# Patient Record
Sex: Male | Born: 1995 | Race: Black or African American | Hispanic: No | Marital: Single | Smoking: Current some day smoker
Health system: Southern US, Community
[De-identification: ages and names within clinical notes are randomized; demographics above are authoritative.]

## PROBLEM LIST (undated history)

## (undated) DIAGNOSIS — K5190 Ulcerative colitis, unspecified, without complications: Secondary | ICD-10-CM

## (undated) DIAGNOSIS — R001 Bradycardia, unspecified: Secondary | ICD-10-CM

---
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
## 2015-03-01 ENCOUNTER — Encounter (HOSPITAL_COMMUNITY): Payer: Self-pay | Admitting: Emergency Medicine

## 2015-03-01 ENCOUNTER — Emergency Department (HOSPITAL_COMMUNITY)
Admission: EM | Admit: 2015-03-01 | Discharge: 2015-03-01 | Disposition: A | Discharge status: Home / Self Care | Payer: 59 | Attending: Emergency Medicine | Admitting: Emergency Medicine

## 2015-03-01 DIAGNOSIS — B9789 Other viral agents as the cause of diseases classified elsewhere: Secondary | ICD-10-CM

## 2015-03-01 DIAGNOSIS — J069 Acute upper respiratory infection, unspecified: Secondary | ICD-10-CM

## 2015-03-01 DIAGNOSIS — H6121 Impacted cerumen, right ear: Secondary | ICD-10-CM | POA: Diagnosis not present

## 2015-03-01 DIAGNOSIS — J029 Acute pharyngitis, unspecified: Secondary | ICD-10-CM | POA: Diagnosis present

## 2015-03-01 LAB — RAPID STREP SCREEN (MED CTR MEBANE ONLY): STREPTOCOCCUS, GROUP A SCREEN (DIRECT): NEGATIVE

## 2015-03-01 MED ORDER — ACETAMINOPHEN 325 MG PO TABS
650.0000 mg | ORAL_TABLET | Freq: Once | ORAL | Status: AC
  Administered 2015-03-01: 650 mg via ORAL
  Filled 2015-03-01: qty 2

## 2015-03-01 NOTE — Discharge Instructions (Signed)
Upper Respiratory Infection, Adult Most upper respiratory infections (URIs) are caused by a virus. A URI affects the nose, throat, and upper air passages. The most common type of URI is often called "the common cold." HOME CARE   Take medicines only as told by your doctor.  Gargle warm saltwater or take cough drops to comfort your throat as told by your doctor.  Use a warm mist humidifier or inhale steam from a shower to increase air moisture. This may make it easier to breathe.  Drink enough fluid to keep your pee (urine) clear or pale yellow.  Eat soups and other clear broths.  Have a healthy diet.  Rest as needed.  Go back to work when your fever is gone or your doctor says it is okay.  You may need to stay home longer to avoid giving your URI to others.  You can also wear a face mask and wash your hands often to prevent spread of the virus.  Use your inhaler more if you have asthma.  Do not use any tobacco products, including cigarettes, chewing tobacco, or electronic cigarettes. If you need help quitting, ask your doctor. GET HELP IF:  You are getting worse, not better.  Your symptoms are not helped by medicine.  You have chills.  You are getting more short of breath.  You have brown or red mucus.  You have yellow or brown discharge from your nose.  You have pain in your face, especially when you bend forward.  You have a fever.  You have puffy (swollen) neck glands.  You have pain while swallowing.  You have white areas in the back of your throat. GET HELP RIGHT AWAY IF:   You have very bad or constant:  Headache.  Ear pain.  Pain in your forehead, behind your eyes, and over your cheekbones (sinus pain).  Chest pain.  You have long-lasting (chronic) lung disease and any of the following:  Wheezing.  Long-lasting cough.  Coughing up blood.  A change in your usual mucus.  You have a stiff neck.  You have changes in  your:  Vision.  Hearing.  Thinking.  Mood. MAKE SURE YOU:   Understand these instructions.  Will watch your condition.  Will get help right away if you are not doing well or get worse.   This information is not intended to replace advice given to you by your health care provider. Make sure you discuss any questions you have with your health care provider.   Document Released: 07/17/2007 Document Revised: 06/14/2014 Document Reviewed: 05/05/2013 Elsevier Interactive Patient Education [employer].

## 2015-03-01 NOTE — ED Notes (Signed)
Patient states sore throat and fever that started yesterday.   Patient states he has taken ibuprofen, nyquil and dayquil for symptoms control with no relief.   Patient also states has some nasal congestion.

## 2015-03-01 NOTE — ED Provider Notes (Signed)
CSN: [id number]     Arrival date & time 03/01/15  0815 History   First MD Initiated Contact with Patient 03/01/15 [phone]     Chief Complaint  Patient presents with  . Sore Throat  . Fever     (Consider location/radiation/quality/duration/timing/severity/associated sxs/prior Treatment) HPI Comments: Greg Grant is a 20 y.o. M with no PMH presenting to ED for evaluation of sore throat.  Patient reports onset of fevers (Tmax 101), sore throat, cough, nasal congestion, post-nasal drip yesterday.  He attempted to take Dayquil, tylenol, and ibuprofen, but did not feel any better so he came in this morning to be evaluated.  Cough is occasionally productive of yellow mucous that seems to be from post-nasal drip.  Patient denies any CP, SOB, LE edema, N/V/D, ear pain.  Wonders if he should have some pain medicine other than tylenol and ibuprofen.  Patient is a 20 y.o. male presenting with pharyngitis. The history is provided by the patient.  Sore Throat This is a new problem. The current episode started yesterday. The problem occurs constantly. The problem has been gradually worsening. Associated symptoms include congestion, coughing, a fever, headaches and a sore throat. Pertinent negatives include no abdominal pain, change in bowel habit, chest pain, nausea or vomiting. Nothing aggravates the symptoms. He has tried acetaminophen and NSAIDs for the symptoms. The treatment provided no relief.    History reviewed. No pertinent past medical history. History reviewed. No pertinent past surgical history. No family history on file. Social History  Substance Use Topics  . Smoking status: Never Smoker   . Smokeless tobacco: None  . Alcohol Use: No    Review of Systems  Constitutional: Positive for fever.  HENT: Positive for congestion and sore throat.   Respiratory: Positive for cough.   Cardiovascular: Negative for chest pain.  Gastrointestinal: Negative for nausea, vomiting, abdominal pain and  change in bowel habit.  Neurological: Positive for headaches.  All other systems reviewed and are negative.     Allergies  Review of patient's allergies indicates no known allergies.  Home Medications   Prior to Admission medications   Medication Sig Start Date End Date Taking? Authorizing Provider  ibuprofen (ADVIL,MOTRIN) 600 MG tablet Take 1 tablet (600 mg total) by mouth every 6 (six) hours as needed. 06/03/14   Tatyana Kirichenko, PA-C   BP 146/90 mmHg  Pulse 82  Temp(Src) 99.4 F (37.4 C) (Oral)  Resp 18  Wt 94.802 kg  SpO2 98% Physical Exam  Constitutional: He is oriented to person, place, and time. He appears well-developed and well-nourished. No distress.  HENT:  Head: Normocephalic and atraumatic.  R TM obscured by cerumen, L TM clear. OP erythematous without exudate. MMM.  Eyes: Conjunctivae and EOM are normal. Pupils are equal, round, and reactive to light. Right eye exhibits no discharge. Left eye exhibits no discharge. No scleral icterus.  Neck: Neck supple.  Cardiovascular: Normal rate, regular rhythm, normal heart sounds and intact distal pulses.   No murmur heard. Pulmonary/Chest: Effort normal and breath sounds normal. No respiratory distress. He has no wheezes. He has no rales.  Abdominal: Soft. He exhibits no distension. There is no tenderness.  Musculoskeletal: He exhibits no edema or tenderness.  Lymphadenopathy:    He has no cervical adenopathy.  Neurological: He is alert and oriented to person, place, and time.  Skin: Skin is warm and dry. No rash noted.  Psychiatric: He has a normal mood and affect. His behavior is normal.    ED Course  Procedures (including critical care time) Labs Review Labs Reviewed  RAPID STREP SCREEN (NOT AT [facility])    Imaging Review No results found. I have personally reviewed and evaluated these images and lab results as part of my medical decision-making.   EKG Interpretation None      MDM   Final diagnoses:   None    20 y.o. M presenting with URI symptoms since yesterday.  Rapid strep negative and sent for culture.  Symptoms and time course consistent with viral URI.  No evidence of pneumonia with clear lung exam, no evidence of AOM or other bacterial infectious process.  Discussed natural course, symptomatic management, and return precautions with patient.  Patient given tylenol x1 in ED for sore throat.  Patient safe for discharge home and agreeable to plan.    Virginia Crews, MD 03/01/15 [id number]  Elnora Morrison, MD 03/01/15 [phone]

## 2015-03-03 LAB — CULTURE, GROUP A STREP (THRC)

## 2016-02-12 HISTORY — PX: BACK SURGERY: SHX140

## 2016-05-23 ENCOUNTER — Encounter (HOSPITAL_COMMUNITY): Payer: Self-pay | Admitting: *Deleted

## 2016-05-23 ENCOUNTER — Emergency Department (HOSPITAL_COMMUNITY)
Admission: EM | Admit: 2016-05-23 | Discharge: 2016-05-23 | Disposition: A | Discharge status: Home / Self Care | Payer: 59 | Attending: Emergency Medicine | Admitting: Emergency Medicine

## 2016-05-23 DIAGNOSIS — X501XXA Overexertion from prolonged static or awkward postures, initial encounter: Secondary | ICD-10-CM | POA: Diagnosis not present

## 2016-05-23 DIAGNOSIS — S3992XA Unspecified injury of lower back, initial encounter: Secondary | ICD-10-CM | POA: Diagnosis present

## 2016-05-23 DIAGNOSIS — Z79899 Other long term (current) drug therapy: Secondary | ICD-10-CM | POA: Insufficient documentation

## 2016-05-23 DIAGNOSIS — S39012A Strain of muscle, fascia and tendon of lower back, initial encounter: Secondary | ICD-10-CM | POA: Diagnosis not present

## 2016-05-23 DIAGNOSIS — Y999 Unspecified external cause status: Secondary | ICD-10-CM | POA: Insufficient documentation

## 2016-05-23 DIAGNOSIS — M5116 Intervertebral disc disorders with radiculopathy, lumbar region: Secondary | ICD-10-CM

## 2016-05-23 DIAGNOSIS — Y929 Unspecified place or not applicable: Secondary | ICD-10-CM | POA: Diagnosis not present

## 2016-05-23 DIAGNOSIS — Y939 Activity, unspecified: Secondary | ICD-10-CM | POA: Insufficient documentation

## 2016-05-23 DIAGNOSIS — M5416 Radiculopathy, lumbar region: Secondary | ICD-10-CM | POA: Diagnosis not present

## 2016-05-23 MED ORDER — OXYCODONE-ACETAMINOPHEN 5-325 MG PO TABS
2.0000 | ORAL_TABLET | Freq: Once | ORAL | Status: AC
  Administered 2016-05-23: 2 via ORAL
  Filled 2016-05-23: qty 2

## 2016-05-23 MED ORDER — PREDNISONE 20 MG PO TABS: ORAL_TABLET | ORAL | 0 refills | Status: DC

## 2016-05-23 MED ORDER — PREDNISONE 20 MG PO TABS
60.0000 mg | ORAL_TABLET | Freq: Once | ORAL | Status: AC
  Administered 2016-05-23: 60 mg via ORAL
  Filled 2016-05-23: qty 3

## 2016-05-23 NOTE — ED Notes (Signed)
Pt stable, understands discharge instructions, and reasons for return.

## 2016-05-23 NOTE — ED Triage Notes (Signed)
Pt is scheduled to have back surgery next month and now with pain to lower back and states he was laying down and twisted his back and has been in pain ever since. Ambulatory.  No incontinence of bowel or urine

## 2016-05-23 NOTE — Discharge Instructions (Signed)
Take prednisone as prescribed.   Call your primary care doctor (Dr. Lavone Neri) and get further pain medication refills from her.   You can call Dr. Saintclair Halsted to get a second opinion. Or you can see your neurosurgeon for follow up   Return to ER if you have worse weakness, numbness, trouble urinating, fever, vomiting.

## 2016-05-23 NOTE — ED Provider Notes (Addendum)
[facility] DEPT Provider Note   CSN: [id number] Arrival date & time: 05/23/16  1428  By signing my name below, I, Greg Grant, attest that this documentation has been prepared under the direction and in the presence of Greg Freeze, Greg Grant. Electronically Signed: Collene Grant, Scribe. 05/23/16. 3:13 PM.  History   Chief Complaint Chief Complaint  Patient presents with  . Back Pain    HPI Comments: Greg Grant is a 21 y.o. male with a history of a slipped disc, who presents to the Emergency Department complaining of chronic back pain, which acutely worsened a few days ago. Patient states he was lying down, when he twisted his back and heard his back pop. Patient states he has had pain ever since. Patient states he has a history of a slipped disc, he is scheduled to have back surgery next month by neurosurgeon in [place]. He has chronic leg weakness that is unchanged. Patient reports taken multiple pain medications including narcotics, injections, and muscle relaxer's with no relief. Patient states his pain is worse with bending over. Patient is ambulatory in the emergency department. Patient denies any lower extremity numbness, worsening weakness, or bowel/bladder incontinence.   Greg Grant narcotic database was reviewed and revealed 30 pills of hydrocodone filled on 3/30.    The history is provided by the patient. No language interpreter was used.    History reviewed. No pertinent past medical history.  There are no active problems to display for this patient.   History reviewed. No pertinent surgical history.     Home Medications    Prior to Admission medications   Medication Sig Start Date End Date Taking? Authorizing Provider  acetaminophen (TYLENOL) 500 MG tablet Take 500 mg by mouth every 6 (six) hours as needed for mild pain.    Historical Provider, Greg Grant  ibuprofen (ADVIL,MOTRIN) 600 MG tablet Take 1 tablet (600 mg total) by mouth every 6 (six) hours as needed. 06/03/14    Greg Senior, PA-C    Family History No family history on file.  Social History Social History  Substance Use Topics  . Smoking status: Never Smoker  . Smokeless tobacco: Never Used  . Alcohol use No     Allergies   Patient has no known allergies.   Review of Systems Review of Systems  Musculoskeletal: Positive for back pain. Negative for gait problem.  Neurological: Negative for weakness and numbness.  All other systems reviewed and are negative.    Physical Exam Updated Vital Signs BP 123/70 (BP Location: Right Arm)   Pulse 72   Resp 18   SpO2 98%   Physical Exam  Constitutional: He is oriented to person, place, and time. He appears well-developed and well-nourished.  HENT:  Head: Normocephalic.  Eyes: EOM are normal.  Neck: Normal range of motion.  Cardiovascular: Normal rate.   Pulmonary/Chest: Effort normal.  Abdominal: He exhibits no distension.  Musculoskeletal: Normal range of motion.  Positive straight leg raise. Right para lumbar tenderness.   Neurological: He is alert and oriented to person, place, and time.  No saddle anesthesia. + straight leg raise R leg. Nl sensation lower extremity. Able to ambulate by himself   Skin: Skin is warm.  Psychiatric: He has a normal mood and affect.  Nursing note and vitals reviewed.    ED Treatments / Results  DIAGNOSTIC STUDIES: Oxygen Saturation is 98% on RA, normal by my interpretation.    COORDINATION OF CARE: 3:13 PM Discussed treatment plan with pt at bedside and pt  agreed to plan, which includes pain medication.    Labs (all labs ordered are listed, but only abnormal results are displayed) Labs Reviewed - No data to display  EKG  EKG Interpretation None       Radiology No results found.  Procedures Procedures (including critical care time)  Medications Ordered in ED Medications - No data to display   Initial Impression / Assessment and Plan / ED Course  I have reviewed the  triage vital signs and the nursing notes.  Pertinent labs & imaging results that were available during my care of the patient were reviewed by me and considered in my medical decision making (see chart for details).     Greg Grant is a 21 y.o. male here with back pain. This is chronic problem and has been going on for years. Had tried injections, narcotics, and muscle relaxants with no relief. Had worsening pain several days ago. Patient's neuro exam is unremarkable and there is no saddle anesthesia. He already has neurosurgery follow up scheduled and a procedure scheduled. I reviewed Greg Grant database and he received 30 7.5 mg vicodin on 3/30 and 30 hydrocodone on 3/13 from Dr. Lavone Grant, Greg Grant. I told him that he can get steroids here and percocet but needs to call Greg Grant for pain medication prescriptions. I offered muscle relaxants but he states that they don't work and refused prescription. Offered IM injection for pain but he is afraid of needles and doesn't want it. He wants a second opinion with neurosurgery so I referred to on call neurosurgeon. No need for further workup in the ED.   Final Clinical Impressions(s) / ED Diagnoses   Final diagnoses:  None    New Prescriptions New Prescriptions   No medications on file   ,I personally performed the services described in this documentation, which was scribed in my presence. The recorded information has been reviewed and is accurate.    Greg Freeze, Greg Grant 05/23/16 Greg Grant Greg Schnelle, Greg Grant 05/23/16 1538

## 2016-06-03 ENCOUNTER — Ambulatory Visit (HOSPITAL_COMMUNITY)
Admission: EM | Admit: 2016-06-03 | Discharge: 2016-06-03 | Disposition: A | Discharge status: Home / Self Care | Payer: 59 | Attending: Family Medicine | Admitting: Family Medicine

## 2016-06-03 ENCOUNTER — Encounter (HOSPITAL_COMMUNITY): Payer: Self-pay | Admitting: Emergency Medicine

## 2016-06-03 DIAGNOSIS — R300 Dysuria: Secondary | ICD-10-CM | POA: Diagnosis not present

## 2016-06-03 DIAGNOSIS — R070 Pain in throat: Secondary | ICD-10-CM | POA: Diagnosis not present

## 2016-06-03 DIAGNOSIS — Z202 Contact with and (suspected) exposure to infections with a predominantly sexual mode of transmission: Secondary | ICD-10-CM

## 2016-06-03 DIAGNOSIS — R369 Urethral discharge, unspecified: Secondary | ICD-10-CM | POA: Diagnosis not present

## 2016-06-03 DIAGNOSIS — J029 Acute pharyngitis, unspecified: Secondary | ICD-10-CM | POA: Diagnosis not present

## 2016-06-03 LAB — POCT URINALYSIS DIP (DEVICE)
Glucose, UA: NEGATIVE mg/dL
Ketones, ur: NEGATIVE mg/dL
Leukocytes, UA: NEGATIVE
NITRITE: NEGATIVE
Protein, ur: NEGATIVE mg/dL
Specific Gravity, Urine: 1.025 (ref 1.005–1.030)
Urobilinogen, UA: 1 mg/dL (ref 0.0–1.0)
pH: 7 (ref 5.0–8.0)

## 2016-06-03 MED ORDER — PHENAZOPYRIDINE HCL 200 MG PO TABS: 200.0000 mg | ORAL_TABLET | Freq: Three times a day (TID) | ORAL | 0 refills | Status: DC

## 2016-06-03 MED ORDER — CEFTRIAXONE SODIUM 250 MG IJ SOLR
INTRAMUSCULAR | Status: AC
  Filled 2016-06-03: qty 250

## 2016-06-03 MED ORDER — STERILE WATER FOR INJECTION IJ SOLN
INTRAMUSCULAR | Status: AC
  Filled 2016-06-03: qty 10

## 2016-06-03 MED ORDER — CEFTRIAXONE SODIUM 250 MG IJ SOLR
250.0000 mg | Freq: Once | INTRAMUSCULAR | Status: AC
  Administered 2016-06-03: 250 mg via INTRAMUSCULAR

## 2016-06-03 MED ORDER — AZITHROMYCIN 250 MG PO TABS
1000.0000 mg | ORAL_TABLET | Freq: Once | ORAL | Status: AC
  Administered 2016-06-03: 1000 mg via ORAL

## 2016-06-03 MED ORDER — AZITHROMYCIN 250 MG PO TABS
ORAL_TABLET | ORAL | Status: AC
  Filled 2016-06-03: qty 4

## 2016-06-03 NOTE — ED Provider Notes (Signed)
[place]    CSN: [id number] Arrival date & time: 06/03/16  1441     History   Chief Complaint Chief Complaint  Patient presents with  . Exposure to STD  . Penile Discharge    HPI Greg Grant is a 21 y.o. male.   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 He has a same sex partner. The partner is new and he has not had protected intercourse. He does have orals sex as well.  He's had the discharge for several days. He has burning as well and complains of a sore throat.        History reviewed. No pertinent past medical history.  There are no active problems to display for this patient.   History reviewed. No pertinent surgical history.     Home Medications    Prior to Admission medications   Medication Sig Start Date End Date Taking? Authorizing Provider  phenazopyridine (PYRIDIUM) 200 MG tablet Take 1 tablet (200 mg total) by mouth 3 (three) times daily. 06/03/16   Robyn Haber, MD    Family History History reviewed. No pertinent family history.  Social History Social History  Substance Use Topics  . Smoking status: Never Smoker  . Smokeless tobacco: Never Used  . Alcohol use No     Allergies   Patient has no known allergies.   Review of Systems Review of Systems  HENT: Positive for sore throat.   Genitourinary: Positive for dysuria.  All other systems reviewed and are negative.    Physical Exam Triage Vital Signs ED Triage Vitals  Enc Vitals Group     BP 06/03/16 1527 135/62     Pulse Rate 06/03/16 1527 (!) 57     Resp 06/03/16 1527 18     Temp 06/03/16 1527 98.5 F (36.9 C)     Temp Source 06/03/16 1527 Oral     SpO2 06/03/16 1527 100 %     Weight --      Height --      Head Circumference --      Peak Flow --      Pain Score 06/03/16 1526 0     Pain Loc --      Pain Edu? --      Excl. in [place]? --    No data found.   Updated Vital  Signs BP 135/62 (BP Location: Right Arm)   Pulse (!) 57   Temp 98.5 F (36.9 C) (Oral)   Resp 18   SpO2 100%    Physical Exam  Constitutional: He is oriented to person, place, and time. He appears well-developed and well-nourished.  HENT:  Head: Normocephalic.  Right Ear: External ear normal.  Left Ear: External ear normal.  Mouth/Throat: Oropharynx is clear and moist.  Eyes: Conjunctivae and EOM are normal. Pupils are equal, round, and reactive to light.  Neck: Normal range of motion. Neck supple.  Pulmonary/Chest: Effort normal.  Genitourinary:  Genitourinary Comments: Green-yellow penile discharge  Musculoskeletal: Normal range of motion.  Neurological: He is alert and oriented to person, place, and time.  Skin: Skin is warm and dry.  Nursing note and vitals reviewed.    UC Treatments / Results  Labs (all labs ordered are listed, but only abnormal results are displayed) Labs Reviewed  POCT URINALYSIS DIP (DEVICE) - Abnormal; Notable for the following:       Result Value   Bilirubin Urine SMALL (*)  Hgb urine dipstick TRACE (*)    All other components within normal limits  HIV ANTIBODY (ROUTINE TESTING)  HSV 2 ANTIBODY, IGG  URINE CYTOLOGY ANCILLARY ONLY    EKG  EKG Interpretation None       Radiology No results found.  Procedures Procedures (including critical care time)  Medications Ordered in UC Medications  cefTRIAXone (ROCEPHIN) injection 250 mg (250 mg Intramuscular Given 06/03/16 1607)  azithromycin (ZITHROMAX) tablet 1,000 mg (1,000 mg Oral Given 06/03/16 1606)     Initial Impression / Assessment and Plan / UC Course  I have reviewed the triage vital signs and the nursing notes.  Pertinent labs & imaging results that were available during my care of the patient were reviewed by me and considered in my medical decision making (see chart for details).     Final Clinical Impressions(s) / UC Diagnoses   Final diagnoses:  STD exposure     New Prescriptions New Prescriptions   PHENAZOPYRIDINE (PYRIDIUM) 200 MG TABLET    Take 1 tablet (200 mg total) by mouth 3 (three) times daily.     Robyn Haber, MD 06/03/16 [phone]

## 2016-06-03 NOTE — ED Notes (Signed)
Dirty and clean urine collected.

## 2016-06-03 NOTE — ED Triage Notes (Signed)
The patient presented to the [facility] with a complaint of an exposure to gonorrhea and he stated he has been having dysuria and a penile discharge.

## 2016-06-04 LAB — HIV ANTIBODY (ROUTINE TESTING W REFLEX): HIV Screen 4th Generation wRfx: NONREACTIVE

## 2016-06-04 LAB — HSV 2 ANTIBODY, IGG: HSV 2 Glycoprotein G Ab, IgG: <0.91 index (ref 0.00–0.90)

## 2016-06-04 LAB — URINE CYTOLOGY ANCILLARY ONLY
Chlamydia: NEGATIVE
Neisseria Gonorrhea: POSITIVE — AB
Trichomonas: NEGATIVE

## 2016-11-20 ENCOUNTER — Emergency Department (HOSPITAL_COMMUNITY)
Admission: EM | Admit: 2016-11-20 | Discharge: 2016-11-21 | Disposition: A | Discharge status: Home / Self Care | Payer: 59 | Attending: Emergency Medicine | Admitting: Emergency Medicine

## 2016-11-20 ENCOUNTER — Encounter (HOSPITAL_COMMUNITY): Payer: Self-pay | Admitting: Emergency Medicine

## 2016-11-20 DIAGNOSIS — W269XXA Contact with unspecified sharp object(s), initial encounter: Secondary | ICD-10-CM | POA: Insufficient documentation

## 2016-11-20 DIAGNOSIS — Y999 Unspecified external cause status: Secondary | ICD-10-CM | POA: Insufficient documentation

## 2016-11-20 DIAGNOSIS — Z5321 Procedure and treatment not carried out due to patient leaving prior to being seen by health care provider: Secondary | ICD-10-CM | POA: Insufficient documentation

## 2016-11-20 DIAGNOSIS — Y9389 Activity, other specified: Secondary | ICD-10-CM | POA: Insufficient documentation

## 2016-11-20 DIAGNOSIS — S6992XA Unspecified injury of left wrist, hand and finger(s), initial encounter: Secondary | ICD-10-CM | POA: Diagnosis present

## 2016-11-20 DIAGNOSIS — Y929 Unspecified place or not applicable: Secondary | ICD-10-CM | POA: Diagnosis not present

## 2016-11-20 MED ORDER — IBUPROFEN 400 MG PO TABS
ORAL_TABLET | ORAL | Status: AC
  Filled 2016-11-20: qty 1

## 2016-11-20 MED ORDER — IBUPROFEN 400 MG PO TABS
400.0000 mg | ORAL_TABLET | Freq: Once | ORAL | Status: AC
  Administered 2016-11-20: 400 mg via ORAL

## 2016-11-20 NOTE — ED Triage Notes (Signed)
Pt presents to ED for assessment of laceration (approx 1-2 inches) to the index finger of his left hand while attempting to cut potatoes tonight.  No underlying structures visible.  Bleeding controlled.  Pt able to move finger.

## 2016-11-20 NOTE — ED Notes (Signed)
No answer in waiting area.

## 2016-12-24 ENCOUNTER — Encounter (HOSPITAL_COMMUNITY): Payer: Self-pay | Admitting: Emergency Medicine

## 2016-12-24 ENCOUNTER — Emergency Department (HOSPITAL_COMMUNITY)
Admission: EM | Admit: 2016-12-24 | Discharge: 2016-12-24 | Disposition: A | Discharge status: Home / Self Care | Payer: 59 | Attending: Emergency Medicine | Admitting: Emergency Medicine

## 2016-12-24 ENCOUNTER — Emergency Department (HOSPITAL_COMMUNITY): Payer: 59

## 2016-12-24 DIAGNOSIS — F17210 Nicotine dependence, cigarettes, uncomplicated: Secondary | ICD-10-CM | POA: Diagnosis not present

## 2016-12-24 DIAGNOSIS — Z9889 Other specified postprocedural states: Secondary | ICD-10-CM | POA: Diagnosis not present

## 2016-12-24 DIAGNOSIS — M545 Low back pain, unspecified: Secondary | ICD-10-CM

## 2016-12-24 DIAGNOSIS — G8929 Other chronic pain: Secondary | ICD-10-CM

## 2016-12-24 MED ORDER — OXYCODONE-ACETAMINOPHEN 5-325 MG PO TABS
1.0000 | ORAL_TABLET | Freq: Once | ORAL | Status: AC
  Administered 2016-12-24: 1 via ORAL
  Filled 2016-12-24: qty 1

## 2016-12-24 MED ORDER — PREDNISONE 10 MG PO TABS: 20.0000 mg | ORAL_TABLET | Freq: Two times a day (BID) | ORAL | 0 refills | Status: DC

## 2016-12-24 MED ORDER — DICLOFENAC SODIUM 50 MG PO TBEC: 50.0000 mg | DELAYED_RELEASE_TABLET | Freq: Two times a day (BID) | ORAL | 0 refills | Status: DC

## 2016-12-24 MED ORDER — CYCLOBENZAPRINE HCL 10 MG PO TABS
10.0000 mg | ORAL_TABLET | Freq: Once | ORAL | Status: AC
  Administered 2016-12-24: 10 mg via ORAL
  Filled 2016-12-24: qty 1

## 2016-12-24 MED ORDER — CYCLOBENZAPRINE HCL 10 MG PO TABS: 10.0000 mg | ORAL_TABLET | Freq: Two times a day (BID) | ORAL | 0 refills | Status: DC | PRN

## 2016-12-24 NOTE — ED Triage Notes (Signed)
Patient reports that had surgery on back in may and had pain since. Reports that surgeon released him still having pain and was told to follow up with PCP but hasnt done that.  Reports since working pain is more severe.

## 2016-12-24 NOTE — ED Provider Notes (Signed)
[facility] Grant Provider Note   CSN: [id number] Arrival date & time: 12/24/16  1657     History   Chief Complaint Chief Complaint  Patient presents with  . Back Pain    HPI Greg Grant is a 21 y.o. male who presents to the ED with back pain. Patient with hx of back pain x 2 years and then dx with herniated disc in the lower back.Patient reports that he had surgery on his back in May but continues to have pain since then. The surgeon, Dr. Yong Channel in [place],  released him but he continues to have pain and was told to f/u with his PCP in [place]. Patient has not f/u with his PCP. He presents today for worsening pain. Patient is in school here in [place] at [employer]. Patient reports taking ibuprofen and tylenol without relief.   HPI  History reviewed. No pertinent past medical history.  There are no active problems to display for this patient.   Past Surgical History:  Procedure Laterality Date  . BACK SURGERY         Home Medications    Prior to Admission medications   Medication Sig Start Date End Date Taking? Authorizing Provider  cyclobenzaprine (FLEXERIL) 10 MG tablet Take 1 tablet (10 mg total) 2 (two) times daily as needed by mouth for muscle spasms. 12/24/16   Ashley Murrain, NP  diclofenac (VOLTAREN) 50 MG EC tablet Take 1 tablet (50 mg total) 2 (two) times daily by mouth. 12/24/16   Ashley Murrain, NP  phenazopyridine (PYRIDIUM) 200 MG tablet Take 1 tablet (200 mg total) by mouth 3 (three) times daily. 06/03/16   Robyn Haber, MD  predniSONE (DELTASONE) 10 MG tablet Take 2 tablets (20 mg total) 2 (two) times daily with a meal by mouth. 12/24/16   Ashley Murrain, NP    Family History No family history on file.  Social History Social History   Tobacco Use  . Smoking status: Current Some Day Smoker    Types: Cigarettes  . Smokeless tobacco: Never Used  Substance Use Topics  . Alcohol use: No  . Drug use: No      Allergies   Toradol [ketorolac tromethamine]   Review of Systems Review of Systems  Constitutional: Negative for chills and fever.  HENT: Negative.   Eyes: Negative for visual disturbance.  Respiratory: Negative for cough and shortness of breath.   Cardiovascular: Negative for chest pain and leg swelling.  Gastrointestinal: Negative for abdominal pain, nausea and vomiting.  Genitourinary: Negative for dysuria and frequency.       No loss of control of bladder or bowels.  Musculoskeletal: Positive for back pain. Negative for neck pain and neck stiffness.  Skin: Negative for wound.  Neurological: Negative for syncope and headaches.  Psychiatric/Behavioral: Negative for confusion. The patient is not nervous/anxious.      Physical Exam Updated Vital Signs BP (!) 147/78 (BP Location: Left Arm)   Pulse (!) 52   Temp 98.7 F (37.1 C) (Oral)   Resp 14   Ht 6' 1"  (1.854 m)   Wt 81.6 kg (180 lb)   SpO2 100%   BMI 23.75 kg/m   Physical Exam  Constitutional: He is oriented to person, place, and time. He appears well-developed and well-nourished. No distress.  HENT:  Head: Normocephalic and atraumatic.  Eyes: EOM are normal. Pupils are equal, round, and reactive to light.  Neck: Normal range of motion. Neck supple.  Cardiovascular: Normal rate  and regular rhythm.  Pulmonary/Chest: Effort normal. No respiratory distress. He has no wheezes. He has no rales.  Abdominal: Soft. Bowel sounds are normal. There is no tenderness.  Musculoskeletal: Normal range of motion. He exhibits no edema.       Lumbar back: He exhibits tenderness. He exhibits normal range of motion, no deformity, no spasm and normal pulse.  Neurological: He is alert and oriented to person, place, and time. He has normal strength.  Reflex Scores:      Bicep reflexes are 2+ on the right side and 2+ on the left side.      Brachioradialis reflexes are 2+ on the right side and 2+ on the left side.      Patellar  reflexes are 2+ on the right side and 2+ on the left side. Ambulatory without foot drag.  Skin: Skin is warm and dry.  Psychiatric: He has a normal mood and affect. His behavior is normal.  Nursing note and vitals reviewed.    ED Treatments / Results  Labs (all labs ordered are listed, but only abnormal results are displayed) Labs Reviewed - No data to display  Radiology Dg Lumbar Spine Complete  Result Date: 12/24/2016 CLINICAL DATA:  Pain in the lower back at surgical site EXAM: LUMBAR SPINE - COMPLETE 4+ VIEW COMPARISON:  None. FINDINGS: There is no evidence of lumbar spine fracture. Alignment is normal. Intervertebral disc spaces are maintained. Calcified phleboliths in the pelvis IMPRESSION: Negative. Electronically Signed   By: Donavan Foil M.D.   On: 12/24/2016 20:45    Procedures Procedures (including critical care time)  Medications Ordered in ED Medications  oxyCODONE-acetaminophen (PERCOCET/ROXICET) 5-325 MG per tablet 1 tablet (1 tablet Oral Given 12/24/16 1941)  cyclobenzaprine (FLEXERIL) tablet 10 mg (10 mg Oral Given 12/24/16 1941)     Initial Impression / Assessment and Plan / ED Course  I have reviewed the triage vital signs and the nursing notes. Patient with hx of chronic back pain and back surgery. No neurological deficits and normal neuro exam.  Patient can walk but states is painful.  No loss of bowel or bladder control.  No concern for cauda equina.  No fever, night sweats, weight loss, h/o cancer, IVDU.  RICE protocol and pain medicine indicated and discussed with patient.   Final Clinical Impressions(s) / ED Diagnoses   Final diagnoses:  Chronic bilateral low back pain without sciatica    ED Discharge Orders        Ordered    cyclobenzaprine (FLEXERIL) 10 MG tablet  2 times daily PRN     12/24/16 2052    diclofenac (VOLTAREN) 50 MG EC tablet  2 times daily     12/24/16 2052    predniSONE (DELTASONE) 10 MG tablet  2 times daily with meals      12/24/16 2108       Debroah Baller [place], [place] 12/24/16 2117    Sherwood Gambler, MD 12/25/16 [phone]

## 2016-12-24 NOTE — Discharge Instructions (Signed)
It is important that you follow up with your doctor to discuss pain management for your continued low back pain.

## 2016-12-27 ENCOUNTER — Emergency Department (HOSPITAL_COMMUNITY)
Admission: EM | Admit: 2016-12-27 | Discharge: 2016-12-28 | Disposition: A | Discharge status: Home / Self Care | Payer: 59 | Attending: Emergency Medicine | Admitting: Emergency Medicine

## 2016-12-27 ENCOUNTER — Encounter (HOSPITAL_COMMUNITY): Payer: Self-pay | Admitting: Emergency Medicine

## 2016-12-27 ENCOUNTER — Emergency Department (HOSPITAL_COMMUNITY): Payer: 59

## 2016-12-27 DIAGNOSIS — Y92410 Unspecified street and highway as the place of occurrence of the external cause: Secondary | ICD-10-CM | POA: Insufficient documentation

## 2016-12-27 DIAGNOSIS — M545 Low back pain, unspecified: Secondary | ICD-10-CM

## 2016-12-27 DIAGNOSIS — F17210 Nicotine dependence, cigarettes, uncomplicated: Secondary | ICD-10-CM | POA: Diagnosis not present

## 2016-12-27 DIAGNOSIS — Y9389 Activity, other specified: Secondary | ICD-10-CM | POA: Diagnosis not present

## 2016-12-27 DIAGNOSIS — Y999 Unspecified external cause status: Secondary | ICD-10-CM | POA: Diagnosis not present

## 2016-12-27 DIAGNOSIS — S3992XA Unspecified injury of lower back, initial encounter: Secondary | ICD-10-CM | POA: Diagnosis present

## 2016-12-27 DIAGNOSIS — G8911 Acute pain due to trauma: Secondary | ICD-10-CM | POA: Insufficient documentation

## 2016-12-27 DIAGNOSIS — Z79899 Other long term (current) drug therapy: Secondary | ICD-10-CM | POA: Insufficient documentation

## 2016-12-27 MED ORDER — HYDROCODONE-ACETAMINOPHEN 5-325 MG PO TABS
2.0000 | ORAL_TABLET | Freq: Once | ORAL | Status: AC
  Administered 2016-12-27: 2 via ORAL
  Filled 2016-12-27: qty 2
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
## 2016-12-28 MED ORDER — HYDROCODONE-ACETAMINOPHEN 5-325 MG PO TABS: 1.0000 | ORAL_TABLET | Freq: Four times a day (QID) | ORAL | 0 refills | Status: DC | PRN

## 2016-12-28 MED ORDER — IBUPROFEN 800 MG PO TABS: 800.0000 mg | ORAL_TABLET | Freq: Three times a day (TID) | ORAL | 0 refills | Status: DC

## 2016-12-28 MED ORDER — CYCLOBENZAPRINE HCL 10 MG PO TABS: 10.0000 mg | ORAL_TABLET | Freq: Two times a day (BID) | ORAL | 0 refills | Status: DC | PRN

## 2016-12-28 NOTE — ED Notes (Signed)
Went in to ambulate pt. And patient couldn't make it to the door without slumping over in pain. I returned pt back to the bed and notified RN.

## 2016-12-28 NOTE — ED Notes (Signed)
PT'S ATTEMPT TO AMBULATE IN THE HALL UNSUCCESSFUL. ERA MADE AWARE.

## 2017-10-10 ENCOUNTER — Encounter (HOSPITAL_COMMUNITY): Payer: Self-pay | Admitting: Emergency Medicine

## 2017-10-10 ENCOUNTER — Ambulatory Visit (HOSPITAL_COMMUNITY)
Admission: EM | Admit: 2017-10-10 | Discharge: 2017-10-10 | Disposition: A | Discharge status: Home / Self Care | Payer: 59 | Attending: Internal Medicine | Admitting: Internal Medicine

## 2017-10-10 DIAGNOSIS — L03012 Cellulitis of left finger: Secondary | ICD-10-CM

## 2017-10-10 MED ORDER — BUPIVACAINE HCL (PF) 0.5 % IJ SOLN
INTRAMUSCULAR | Status: AC
  Filled 2017-10-10: qty 10

## 2017-10-10 MED ORDER — DICLOFENAC SODIUM 50 MG PO TBEC: 50.0000 mg | DELAYED_RELEASE_TABLET | Freq: Two times a day (BID) | ORAL | 0 refills | Status: AC

## 2017-10-10 MED ORDER — AMOXICILLIN-POT CLAVULANATE 875-125 MG PO TABS: 1.0000 | ORAL_TABLET | Freq: Two times a day (BID) | ORAL | 0 refills | Status: AC

## 2017-10-10 NOTE — Discharge Instructions (Signed)
Start augmentin as directed. Start diclofenac as directed. You can take tylenol for additional pain relief needed. Warm compress to the finger. If experiencing worsening symptoms, pain to the finger pad, numbness, discoloration to the finger tip, go to the emergency department for further evaluation. Otherwise, follow up with hand orthopedics if symptoms not improving, does not resolve.

## 2017-10-10 NOTE — ED Provider Notes (Signed)
[facility]    CSN: [id number] Arrival date & time: 10/10/17  1211     History   Chief Complaint Chief Complaint  Patient presents with  . Nail Problem    HPI Greg Grant is a 22 y.o. male.   22 year old male comes in for 3-week history of left thumb swelling around the nailbed.  No known injury/trauma.  States swelling has increased, erythema, pain, came in for evaluation.  Denies fever, chills, night sweats.  Denies numbness/tingling to the fingers.  Can move thumb without difficulty.  Has not done anything for the symptoms.     History reviewed. No pertinent past medical history.  There are no active problems to display for this patient.   Past Surgical History:  Procedure Laterality Date  . BACK SURGERY         Home Medications    Prior to Admission medications   Medication Sig Start Date End Date Taking? Authorizing Provider  amoxicillin-clavulanate (AUGMENTIN) 875-125 MG tablet Take 1 tablet by mouth every 12 (twelve) hours. 10/10/17   Tasia Catchings, Roshanda Balazs V, PA-C  diclofenac (VOLTAREN) 50 MG EC tablet Take 1 tablet (50 mg total) by mouth 2 (two) times daily for 10 days. 10/10/17 10/20/17  Ok Edwards, PA-C    Family History Family History  Problem Relation Age of Onset  . Healthy Neg Hx     Social History Social History   Tobacco Use  . Smoking status: Current Some Day Smoker    Types: Cigarettes  . Smokeless tobacco: Never Used  Substance Use Topics  . Alcohol use: No  . Drug use: No     Allergies   Toradol [ketorolac tromethamine]   Review of Systems Review of Systems  Reason unable to perform ROS: See HPI as above.     Physical Exam Triage Vital Signs ED Triage Vitals [10/10/17 1229]  Enc Vitals Group     BP 140/83     Pulse Rate (!) 50     Resp 16     Temp 98.6 F (37 C)     Temp src      SpO2 99 %     Weight      Height      Head Circumference      Peak Flow      Pain Score      Pain Loc      Pain Edu?      Excl. in  [place]?    No data found.  Updated Vital Signs BP 140/83   Pulse (!) 50   Temp 98.6 F (37 C)   Resp 16   SpO2 99%   Physical Exam  Constitutional: He is oriented to person, place, and time. He appears well-developed and well-nourished. No distress.  HENT:  Head: Normocephalic and atraumatic.  Eyes: Pupils are equal, round, and reactive to light. Conjunctivae are normal.  Musculoskeletal:  Swelling to the left thumb nailbed with erythema, warmth.  Patient with pain to the nailbed, tender to light touch, unable to fully evaluate.  No tenderness to palpation of finger pad.  Full range of motion of the finger.  Sensation intact.  Radial pulse 2+, cap refill less than 2 seconds.  After digital block, no fluctuance felt. No obvious paronychia.   Neurological: He is alert and oriented to person, place, and time.  Skin: He is not diaphoretic.       UC Treatments / Results  Labs (all labs ordered are listed, but only  abnormal results are displayed) Labs Reviewed - No data to display  EKG None  Radiology No results found.  Procedures Nerve Block Date/Time: 10/10/2017 1:09 PM Performed by: Ok Edwards, PA-C Authorized by: Wynona Luna, MD   Consent:    Consent obtained:  Verbal   Consent given by:  Patient   Risks discussed:  Infection, nerve damage, swelling, unsuccessful block, pain and bleeding   Alternatives discussed:  No treatment and alternative treatment Indications:    Indications:  Pain relief and procedural anesthesia Location:    Body area:  Upper extremity   Upper extremity nerve blocked: digital block.   Laterality:  Left Pre-procedure details:    Skin preparation:  Alcohol Skin anesthesia (see MAR for exact dosages):    Skin anesthesia method:  None Procedure details (see MAR for exact dosages):    Block needle gauge:  27 G   Anesthetic injected:  Bupivacaine 0.5% w/o epi and lidocaine 2% w/o epi   Steroid injected:  None   Additive injected:   None   Injection procedure:  Anatomic landmarks identified, incremental injection, negative aspiration for blood, anatomic landmarks palpated and introduced needle   Paresthesia:  None Post-procedure details:    Dressing:  None   Outcome:  Anesthesia achieved   Patient tolerance of procedure:  Tolerated well, no immediate complications   (including critical care time)  Medications Ordered in UC Medications - No data to display  Initial Impression / Assessment and Plan / UC Course  I have reviewed the triage vital signs and the nursing notes.  Pertinent labs & imaging results that were available during my care of the patient were reviewed by me and considered in my medical decision making (see chart for details).    Patient is very tender along nail bed, unable to fully assess possible paronychia.  Discussed with patient, will provide digital block to the area for better assessment.  Patient expresses understanding and agrees with plan.  No obvious paronychia.  Will start Augmentin as directed.  Diclofenac for pain.  Return precautions given.  Patient expresses understanding and agrees to plan.  Final Clinical Impressions(s) / UC Diagnoses   Final diagnoses:  Cellulitis of finger of left hand    ED Prescriptions    Medication Sig Dispense Auth. Provider   diclofenac (VOLTAREN) 50 MG EC tablet Take 1 tablet (50 mg total) by mouth 2 (two) times daily for 10 days. 20 tablet Caliyah Sieh V, PA-C   amoxicillin-clavulanate (AUGMENTIN) 875-125 MG tablet Take 1 tablet by mouth every 12 (twelve) hours. 14 tablet Tobin Chad, [place] 10/10/17 1310

## 2017-10-10 NOTE — ED Triage Notes (Signed)
Pt c/o L thumb swelling around the nail bed x3 weeks.

## 2018-02-15 ENCOUNTER — Encounter (HOSPITAL_COMMUNITY): Payer: Self-pay | Admitting: Emergency Medicine

## 2018-02-15 ENCOUNTER — Emergency Department (HOSPITAL_COMMUNITY)
Admission: EM | Admit: 2018-02-15 | Discharge: 2018-02-15 | Disposition: A | Discharge status: Home / Self Care | Payer: 59 | Attending: Emergency Medicine | Admitting: Emergency Medicine

## 2018-02-15 ENCOUNTER — Other Ambulatory Visit: Payer: Self-pay

## 2018-02-15 DIAGNOSIS — F17210 Nicotine dependence, cigarettes, uncomplicated: Secondary | ICD-10-CM | POA: Insufficient documentation

## 2018-02-15 DIAGNOSIS — R030 Elevated blood-pressure reading, without diagnosis of hypertension: Secondary | ICD-10-CM | POA: Insufficient documentation

## 2018-02-15 DIAGNOSIS — M545 Low back pain: Secondary | ICD-10-CM | POA: Diagnosis present

## 2018-02-15 DIAGNOSIS — J069 Acute upper respiratory infection, unspecified: Secondary | ICD-10-CM | POA: Diagnosis not present

## 2018-02-15 DIAGNOSIS — M5416 Radiculopathy, lumbar region: Secondary | ICD-10-CM | POA: Diagnosis not present

## 2018-02-15 MED ORDER — HYDROCODONE-ACETAMINOPHEN 5-325 MG PO TABS: 1.0000 | ORAL_TABLET | Freq: Four times a day (QID) | ORAL | 0 refills | Status: AC | PRN

## 2018-02-15 MED ORDER — ACETAMINOPHEN 325 MG PO TABS
650.0000 mg | ORAL_TABLET | Freq: Once | ORAL | Status: AC
  Administered 2018-02-15: 650 mg via ORAL
  Filled 2018-02-15: qty 2

## 2018-02-15 NOTE — Discharge Instructions (Addendum)
Take Tylenol for mild pain or the pain medicine prescribed for bad pain.  Do not take Tylenol together with the pain medicine prescribed as the combination can be dangerous to your liver.  See your primary care provider or student health if not feeling better in a few days.  Your blood pressure should be rechecked within the next 3 weeks.  Today's was mildly elevated at 155/94.

## 2018-02-15 NOTE — ED Notes (Signed)
Dr. Winfred Leeds aware of pt's BP.

## 2018-02-15 NOTE — ED Provider Notes (Addendum)
[facility] EMERGENCY DEPARTMENT Provider Note   CSN: [id number] Arrival date & time: 02/15/18  1018     History   Chief Complaint Chief Complaint  Patient presents with  . Back Pain    HPI Greg Grant is a 23 y.o. male.  Complains of low back pain radiating to bilateral mid thighs for the past 2 weeks.  Pain is worse with walking or changing positions not improved by anything.  No abdominal pain.  Other associated symptoms include nasal congestion and cough for the past few days.  He denies any shortness of breath nausea or vomiting.  No loss of bladder or bowel control.  Patient suffers from chronic back pain.  Is been worse over the past 2 weeks.  with Tylenol, without relief.  Last dose of Tylenol last night  HPI  History reviewed. No pertinent past medical history.  There are no active problems to display for this patient. Past medical history ulcerative colitis, anxiety  Past Surgical History:  Procedure Laterality Date  . BACK SURGERY          Home Medications    Prior to Admission medications   Medication Sig Start Date End Date Taking? Authorizing Provider  amoxicillin-clavulanate (AUGMENTIN) 875-125 MG tablet Take 1 tablet by mouth every 12 (twelve) hours. 10/10/17   Tasia Catchings, Amy V, PA-C   hydroxyzine, prednisone  Family History Family History  Problem Relation Age of Onset  . Healthy Neg Hx     Social History Social History   Tobacco Use  . Smoking status: Current Some Day Smoker    Types: Cigarettes  . Smokeless tobacco: Never Used  Substance Use Topics  . Alcohol use: No  . Drug use: No     Allergies   Toradol [ketorolac tromethamine]   Review of Systems Review of Systems  HENT: Positive for congestion.   Respiratory: Positive for cough.   Musculoskeletal: Positive for back pain.  Allergic/Immunologic: Positive for immunocompromised state.       Chronically on prednisone  All other systems reviewed and are  negative.    Physical Exam Updated Vital Signs BP (!) 147/94 (BP Location: Right Arm)   Pulse 91   Temp (!) 100.6 F (38.1 C) (Oral)   Resp 20   Ht 6' 2"  (1.88 m)   Wt 81.6 kg   SpO2 100%   BMI 23.11 kg/m   Physical Exam Vitals signs and nursing note reviewed.  Constitutional:      Appearance: He is well-developed. He is not ill-appearing.  HENT:     Head: Normocephalic and atraumatic.     Comments: Positive nasal congestion.  Oropharynx normal. Eyes:     Conjunctiva/sclera: Conjunctivae normal.     Pupils: Pupils are equal, round, and reactive to light.  Neck:     Musculoskeletal: Neck supple.     Thyroid: No thyromegaly.     Trachea: No tracheal deviation.  Cardiovascular:     Rate and Rhythm: Normal rate and regular rhythm.     Heart sounds: No murmur.  Pulmonary:     Effort: Pulmonary effort is normal.     Breath sounds: Normal breath sounds.  Abdominal:     General: Bowel sounds are normal. There is no distension.     Palpations: Abdomen is soft.     Tenderness: There is no abdominal tenderness.  Musculoskeletal: Normal range of motion.        General: No tenderness.     Comments: Surgical scar over  lumbar area.  He has pain at lumbar area upon standing from a sitting position.  No point tenderness along spine.  Skin:    General: Skin is warm and dry.     Findings: No rash.  Neurological:     Mental Status: He is alert.     Coordination: Coordination normal.     Comments: Gait normal.  DTRs symmetric bilaterally at knee jerk ankle jerk.  Toes downgoing bilaterally.  Motor strength 5/5 overall      ED Treatments / Results  Labs (all labs ordered are listed, but only abnormal results are displayed) Labs Reviewed - No data to display  EKG None  Radiology No results found.  Procedures Procedures (including critical care time)  Medications Ordered in ED Medications - No data to display   Initial Impression / Assessment and Plan / ED Course  I  have reviewed the triage vital signs and the nursing notes.  Pertinent labs & imaging results that were available during my care of the patient were reviewed by me and considered in my medical decision making (see chart for details).     Patient with low-grade fever.  Likely secondary to URI.  Strongly doubt spinous infection.  He has chronic back pain.  Plan prescription [place] Controlled Substance reporting System queriedFollow-up with PMD if not better in 2 to 3 days.  Blood pressure recheck 3 week  Final Clinical Impressions(s) / ED Diagnoses  Diagnoses #1 lumbar radiculopathy #2 URI #3 elevated blood pressure Final diagnoses:  None    ED Discharge Orders    None       Orlie Dakin, MD 02/15/18 1224    Orlie Dakin, MD 02/15/18 [place], MD 02/15/18 1229

## 2018-03-24 ENCOUNTER — Emergency Department (HOSPITAL_COMMUNITY)
Admission: EM | Admit: 2018-03-24 | Discharge: 2018-03-24 | Disposition: A | Discharge status: Home / Self Care | Payer: 59 | Attending: Emergency Medicine | Admitting: Emergency Medicine

## 2018-03-24 ENCOUNTER — Encounter (HOSPITAL_COMMUNITY): Payer: Self-pay | Admitting: *Deleted

## 2018-03-24 ENCOUNTER — Other Ambulatory Visit: Payer: Self-pay

## 2018-03-24 DIAGNOSIS — F17210 Nicotine dependence, cigarettes, uncomplicated: Secondary | ICD-10-CM | POA: Insufficient documentation

## 2018-03-24 DIAGNOSIS — M5431 Sciatica, right side: Secondary | ICD-10-CM | POA: Insufficient documentation

## 2018-03-24 DIAGNOSIS — M545 Low back pain: Secondary | ICD-10-CM | POA: Diagnosis present

## 2018-03-24 HISTORY — DX: Ulcerative colitis, unspecified, without complications: K51.90

## 2018-03-24 HISTORY — DX: Bradycardia, unspecified: R00.1

## 2018-03-24 MED ORDER — OXYCODONE-ACETAMINOPHEN 5-325 MG PO TABS
1.0000 | ORAL_TABLET | Freq: Once | ORAL | Status: AC
  Administered 2018-03-24: 1 via ORAL
  Filled 2018-03-24: qty 1

## 2018-03-24 MED ORDER — CYCLOBENZAPRINE HCL 10 MG PO TABS
10.0000 mg | ORAL_TABLET | Freq: Once | ORAL | Status: AC
  Administered 2018-03-24: 10 mg via ORAL
  Filled 2018-03-24: qty 1

## 2018-03-24 MED ORDER — CYCLOBENZAPRINE HCL 10 MG PO TABS: 10.0000 mg | ORAL_TABLET | Freq: Two times a day (BID) | ORAL | 0 refills | Status: AC | PRN

## 2018-03-24 NOTE — ED Provider Notes (Signed)
[facility] DEPT Provider Note   CSN: [id number] Arrival date & time: 03/24/18  1309     History   Chief Complaint Chief Complaint  Patient presents with  . Back Pain    HPI Greg Grant is a 23 y.o. male who presents to the ED with c/o back pain. Patient with hx of chronic lumbar pain that radiates to his right leg. He reports having surgery in the past for herniated disc. Patient reports this pain became worse 2 days ago. Patient denies any recent injury. He has been taking tylenol without relief. Patient states he can not take ibuprofen due to ulcerative colitis.  The history is provided by the patient. No language interpreter was used.  Back Pain  Location:  Lumbar spine   Past Medical History:  Diagnosis Date  . Ulcerative colitis ([place])     There are no active problems to display for this patient.   Past Surgical History:  Procedure Laterality Date  . BACK SURGERY  2018   In [facility] Medications    Prior to Admission medications   Medication Sig Start Date End Date Taking? Authorizing Provider  amoxicillin-clavulanate (AUGMENTIN) 875-125 MG tablet Take 1 tablet by mouth every 12 (twelve) hours. 10/10/17   Tasia Catchings, Amy V, PA-C  HYDROcodone-acetaminophen (NORCO) 5-325 MG tablet Take 1-2 tablets by mouth every 6 (six) hours as needed for severe pain. 02/15/18   Orlie Dakin, MD    Family History Family History  Problem Relation Age of Onset  . Healthy Neg Hx     Social History Social History   Tobacco Use  . Smoking status: Current Some Day Smoker    Types: Cigarettes  . Smokeless tobacco: Never Used  Substance Use Topics  . Alcohol use: No  . Drug use: No     Allergies   Toradol [ketorolac tromethamine]   Review of Systems Review of Systems  Genitourinary:       No loss of control of bladder or bowels.  Musculoskeletal: Positive for back pain.  All other systems reviewed and are  negative.    Physical Exam Updated Vital Signs BP (!) 136/92 (BP Location: Left Arm)   Pulse (!) 45   Temp 98.8 F (37.1 C) (Oral)   Resp 16   SpO2 99%   Physical Exam Vitals signs and nursing note reviewed.  Constitutional:      Appearance: He is well-developed.  HENT:     Head: Normocephalic and atraumatic.     Nose: Nose normal.     Mouth/Throat:     Mouth: Mucous membranes are moist.  Eyes:     Conjunctiva/sclera: Conjunctivae normal.  Neck:     Musculoskeletal: Neck supple.  Cardiovascular:     Rate and Rhythm: Bradycardia present.  Pulmonary:     Effort: Pulmonary effort is normal.  Abdominal:     Palpations: Abdomen is soft.     Tenderness: There is no abdominal tenderness.  Musculoskeletal:     Lumbar back: He exhibits decreased range of motion (due to pain), tenderness and spasm.       Back:     Comments: Scar noted lower midline due to previous surgery. Tender with palpation and range of motion right lower back and right sciatic nerve.   Skin:    General: Skin is warm and dry.  Neurological:     Mental Status: He is alert and oriented to person, place, and time.  Sensory: Sensation is intact.     Gait: Gait is intact.     Deep Tendon Reflexes: Reflexes are normal and symmetric.  Psychiatric:        Mood and Affect: Mood normal.      ED Treatments / Results  Labs (all labs ordered are listed, but only abnormal results are displayed) Labs Reviewed - No data to display  Radiology No results found.  Procedures Procedures (including critical care time)  Medications Ordered in ED Medications  oxyCODONE-acetaminophen (PERCOCET/ROXICET) 5-325 MG per tablet 1 tablet (has no administration in time range)  cyclobenzaprine (FLEXERIL) tablet 10 mg (has no administration in time range)     Initial Impression / Assessment and Plan / ED Course  I have reviewed the triage vital signs and the nursing notes. Patient with back pain.  No neurological  deficits and normal neuro exam.  Patient can walk but states is painful.  No loss of bowel or bladder control.  No concern for cauda equina.  No fever, night sweats, weight loss, h/o cancer, IVDU.  RICE protocol and pain medicine indicated and discussed with patient.  Final Clinical Impressions(s) / ED Diagnoses   Final diagnoses:  Sciatica of right side    ED Discharge Orders    None       Debroah Baller [place], [place] 03/24/18 1508    Daleen Bo, MD 03/25/18 1538

## 2018-03-24 NOTE — ED Triage Notes (Signed)
Pt reports chronic lumbar pain that radiates to his R leg x 2 days.  He had back surgery x 2 years ago d/t herniated disc and have been having pain but became severe x 2 days ago.  Denies any injury.  He has tried Motrin without relief.

## 2018-03-24 NOTE — Discharge Instructions (Addendum)
Follow up with your doctor as planned this week. Return here as needed.
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
